# Patient Record
Sex: Female | Born: 1965 | Race: White | Hispanic: No | Marital: Single | Smoking: Current every day smoker
Health system: Southern US, Community
[De-identification: ages and names within clinical notes are randomized; demographics above are authoritative.]

## PROBLEM LIST (undated history)

## (undated) DIAGNOSIS — F1010 Alcohol abuse, uncomplicated: Secondary | ICD-10-CM

## (undated) DIAGNOSIS — I10 Essential (primary) hypertension: Secondary | ICD-10-CM

## (undated) DIAGNOSIS — C801 Malignant (primary) neoplasm, unspecified: Secondary | ICD-10-CM

## (undated) HISTORY — PX: OTHER SURGICAL HISTORY: SHX169

## (undated) HISTORY — PX: MASTECTOMY: SHX3

---

## 2000-11-16 ENCOUNTER — Emergency Department (HOSPITAL_COMMUNITY): Admission: EM | Admit: 2000-11-16 | Discharge: 2000-11-16 | Payer: Self-pay | Admitting: Emergency Medicine

## 2000-11-16 ENCOUNTER — Encounter: Payer: Self-pay | Admitting: Emergency Medicine

## 2016-04-25 ENCOUNTER — Encounter (HOSPITAL_COMMUNITY): Payer: Self-pay | Admitting: Emergency Medicine

## 2016-04-25 ENCOUNTER — Emergency Department (HOSPITAL_COMMUNITY): Payer: BLUE CROSS/BLUE SHIELD

## 2016-04-25 ENCOUNTER — Emergency Department (HOSPITAL_COMMUNITY)
Admission: EM | Admit: 2016-04-25 | Discharge: 2016-04-25 | Disposition: A | Discharge status: Home / Self Care | Payer: BLUE CROSS/BLUE SHIELD | Attending: Emergency Medicine | Admitting: Emergency Medicine

## 2016-04-25 DIAGNOSIS — Z853 Personal history of malignant neoplasm of breast: Secondary | ICD-10-CM | POA: Insufficient documentation

## 2016-04-25 DIAGNOSIS — F17210 Nicotine dependence, cigarettes, uncomplicated: Secondary | ICD-10-CM | POA: Insufficient documentation

## 2016-04-25 DIAGNOSIS — M546 Pain in thoracic spine: Secondary | ICD-10-CM | POA: Diagnosis present

## 2016-04-25 DIAGNOSIS — R200 Anesthesia of skin: Secondary | ICD-10-CM | POA: Diagnosis not present

## 2016-04-25 DIAGNOSIS — I10 Essential (primary) hypertension: Secondary | ICD-10-CM | POA: Insufficient documentation

## 2016-04-25 DIAGNOSIS — M79601 Pain in right arm: Secondary | ICD-10-CM | POA: Insufficient documentation

## 2016-04-25 HISTORY — DX: Malignant (primary) neoplasm, unspecified: C80.1

## 2016-04-25 HISTORY — DX: Essential (primary) hypertension: I10

## 2016-04-25 HISTORY — DX: Alcohol abuse, uncomplicated: F10.10

## 2016-04-25 LAB — URINALYSIS, ROUTINE W REFLEX MICROSCOPIC
Bilirubin Urine: NEGATIVE
Glucose, UA: NEGATIVE mg/dL
Hgb urine dipstick: NEGATIVE
Ketones, ur: NEGATIVE mg/dL
Leukocytes, UA: NEGATIVE
Nitrite: NEGATIVE
Protein, ur: NEGATIVE mg/dL
Specific Gravity, Urine: 1.015 (ref 1.005–1.030)
pH: 7.5 (ref 5.0–8.0)

## 2016-04-25 LAB — CBC WITH DIFFERENTIAL/PLATELET
Basophils Absolute: 0 10*3/uL (ref 0.0–0.1)
Basophils Relative: 0 %
Eosinophils Absolute: 0.3 10*3/uL (ref 0.0–0.7)
Eosinophils Relative: 5 %
HCT: 34.3 % — ABNORMAL LOW (ref 36.0–46.0)
Hemoglobin: 11.6 g/dL — ABNORMAL LOW (ref 12.0–15.0)
Lymphocytes Relative: 18 %
Lymphs Abs: 1.3 10*3/uL (ref 0.7–4.0)
MCH: 32.8 pg (ref 26.0–34.0)
MCHC: 33.8 g/dL (ref 30.0–36.0)
MCV: 96.9 fL (ref 78.0–100.0)
Monocytes Absolute: 0.7 10*3/uL (ref 0.1–1.0)
Monocytes Relative: 10 %
Neutro Abs: 4.6 10*3/uL (ref 1.7–7.7)
Neutrophils Relative %: 67 %
Platelets: 222 10*3/uL (ref 150–400)
RBC: 3.54 MIL/uL — ABNORMAL LOW (ref 3.87–5.11)
RDW: 13.1 % (ref 11.5–15.5)
WBC: 6.9 10*3/uL (ref 4.0–10.5)

## 2016-04-25 LAB — COMPREHENSIVE METABOLIC PANEL
ALT: 16 U/L (ref 14–54)
AST: 19 U/L (ref 15–41)
Albumin: 4 g/dL (ref 3.5–5.0)
Alkaline Phosphatase: 90 U/L (ref 38–126)
Anion gap: 7 (ref 5–15)
BUN: 10 mg/dL (ref 6–20)
CO2: 27 mmol/L (ref 22–32)
Calcium: 9.1 mg/dL (ref 8.9–10.3)
Chloride: 103 mmol/L (ref 101–111)
Creatinine, Ser: 0.96 mg/dL (ref 0.44–1.00)
GFR calc Af Amer: >60 mL/min (ref >60)
GFR calc non Af Amer: >60 mL/min (ref >60)
Glucose, Bld: 110 mg/dL — ABNORMAL HIGH (ref 65–99)
Potassium: 3.8 mmol/L (ref 3.5–5.1)
Sodium: 137 mmol/L (ref 135–145)
Total Bilirubin: 1 mg/dL (ref 0.3–1.2)
Total Protein: 6.8 g/dL (ref 6.5–8.1)

## 2016-04-25 MED ORDER — CYCLOBENZAPRINE HCL 10 MG PO TABS: 10.0000 mg | ORAL_TABLET | Freq: Three times a day (TID) | ORAL | 0 refills | Status: AC | PRN

## 2016-04-25 MED ORDER — MELOXICAM 7.5 MG PO TABS: 7.5000 mg | ORAL_TABLET | Freq: Every day | ORAL | 0 refills | Status: DC

## 2016-04-25 MED ORDER — LIDOCAINE 5 % EX PTCH: 1.0000 | MEDICATED_PATCH | CUTANEOUS | 0 refills | Status: AC

## 2016-04-25 NOTE — Progress Notes (Addendum)
Urine obtained and sent. Pt is in the x-ray dept. (4:20pm)Bloods drawn and sent. Pt c/o joint pain all over times 7 days. Pt is still waiting for blood work results. (5pm) Results back PA aware.(5:40pm)

## 2016-04-25 NOTE — Discharge Instructions (Signed)
Read the information below.  Use the prescribed medication as directed.  Please discuss all new medications with your pharmacist.  You may return to the Emergency Department at any time for worsening condition or any new symptoms that concern you.   If you develop uncontrolled pain, weakness or numbness of the extremity, severe discoloration of the skin, or you are unable to walk or move your arm, return to the ER for a recheck.

## 2016-04-25 NOTE — ED Triage Notes (Addendum)
Pt complaint of back pain for 4 days; acute worsening yesterday.

## 2016-04-25 NOTE — ED Provider Notes (Signed)
[facility] DEPT Provider Note  First Provider Contact:   First MD Initiated Contact with Patient 04/25/16 1547     By signing my name below, I, Bea Graff, attest that this documentation has been prepared under the direction and in the presence of [facility], PA-C. Electronically Signed: Bea Graff, ED Scribe. 04/25/16. 4:06 PM.  History   Chief Complaint Chief Complaint  Patient presents with  . Back Pain   The history is provided by the patient and medical records. No language interpreter was used.    HPI Comments:  Candice Johnson is a 50 y.o. female who presents to the Emergency Department complaining of mid back pain that began about four days ago. She reports some intermittent tingling and numbness in the fingers right hand that began about 10 days ago. She reports some intermittent sharp pains in the left hand a few days ago that has since resolved. She now reports some sharp, shooting bilateral knee pain and reports generalized pain in all of her joints. She states she contacted her PCP yesterday and was told to keep her previously scheduled appt in five days and report to the ED if symptoms worsen. She has not taken anything for pain. Ambulating and moving around increases her pain. She denies alleviating factors. She denies fever, chills, nausea, vomiting, CP, SOB, abdominal pain, diarrhea, urinary symptoms. She states she was on Lisinopril but was stopped 10-12 days ago due to a dry cough that has resolved upon discontinuation of this medication. She recently stopped her Lactulose as well due to high ammonia levels.  Pt is right-hand dominant. She denies h/o arthritis. She denies any known tick bites. She reports h/o psoriasis and uses hydrocortisone lotion for treatment. She reports h/o breast cancer and had a double mastectomy 12 years ago (2005) and was on Tamoxifen for five years afterwards. She denies any heavy lifting, trauma, injury or fall.  Past Medical History:   Diagnosis Date  . Cancer (HCC)    breast  . ETOH abuse   . Hypertension     There are no active problems to display for this patient.   Past Surgical History:  Procedure Laterality Date  . left knee surgery    . MASTECTOMY      OB History    No data available       Home Medications    Prior to Admission medications   Not on File    Family History No family history on file.  Social History Social History  Substance Use Topics  . Smoking status: Current Every Day Smoker    Packs/day: 0.50    Types: Cigarettes  . Smokeless tobacco: Not on file  . Alcohol use No     Allergies   Review of patient's allergies indicates no known allergies.   Review of Systems Review of Systems  Constitutional: Negative for chills and fever.  Respiratory: Negative for shortness of breath.   Cardiovascular: Negative for chest pain.  Gastrointestinal: Negative for abdominal pain, diarrhea, nausea and vomiting.  Genitourinary: Negative for decreased urine volume, difficulty urinating, dysuria, frequency, hematuria and urgency.  Musculoskeletal: Positive for arthralgias and back pain.  Neurological: Positive for numbness (tingling).     Physical Exam Updated Vital Signs BP 119/74 (BP Location: Left Arm)   Pulse 90   Temp 98.3 F (36.8 C) (Oral)   Resp 16   SpO2 96%   Physical Exam  Constitutional: She appears well-developed and well-nourished. No distress.  HENT:  Head: Normocephalic  and atraumatic.  Neck: Neck supple.  Cardiovascular: Normal rate, regular rhythm and normal heart sounds.  Exam reveals no gallop and no friction rub.   No murmur heard. Pulmonary/Chest: Effort normal and breath sounds normal. No respiratory distress. She has no wheezes. She has no rales.  Abdominal: Soft. Bowel sounds are normal. She exhibits no distension. There is tenderness (diffuse). There is no rebound and no guarding.  Musculoskeletal: She exhibits tenderness.  Mild tenderness to  upper thoracic spine.  No joints with erythema, edema, warmth.    Neurological: She is alert.  Lower extremities:  Strength 5/5, sensation intact, distal pulses intact. Symmetric weakness throughout extremities.  Gait is slow but even and steady.   Skin: She is not diaphoretic.  Nursing note and vitals reviewed.    ED Treatments / Results  Labs (all labs ordered are listed, but only abnormal results are displayed) Labs Reviewed  COMPREHENSIVE METABOLIC PANEL - Abnormal; Notable for the following:       Result Value   Glucose, Bld 110 (*)    All other components within normal limits  CBC WITH DIFFERENTIAL/PLATELET - Abnormal; Notable for the following:    RBC 3.54 (*)    Hemoglobin 11.6 (*)    HCT 34.3 (*)    All other components within normal limits  URINALYSIS, ROUTINE W REFLEX MICROSCOPIC (NOT AT [facility]) - Abnormal; Notable for the following:    APPearance CLOUDY (*)    All other components within normal limits    EKG  EKG Interpretation None       Radiology Dg Chest 2 View  Result Date: 04/25/2016 CLINICAL DATA:  Pain without trauma EXAM: CHEST  2 VIEW COMPARISON:  None. FINDINGS: The heart size and mediastinal contours are within normal limits. Both lungs are clear. The visualized skeletal structures are unremarkable. IMPRESSION: No active cardiopulmonary disease. Electronically Signed   By: Dorise Bullion III M.D   On: 04/25/2016 17:12   Dg Cervical Spine Complete  Result Date: 04/25/2016 CLINICAL DATA:  Neck pain for 4 days.  No trauma. EXAM: CERVICAL SPINE - COMPLETE 4+ VIEW COMPARISON:  None. FINDINGS: The pre odontoid space and prevertebral soft tissues are normal. 2 mm of retrolisthesis of C5 versus C6 is probably degenerative given the lack of trauma. No other malalignment. No fracture identified. Mild degenerative changes at C5-6. The neural foramina are patent. The lateral masses of C1 align with C2. The odontoid process is normal. Lung apices are unremarkable.  IMPRESSION: Minimal retrolisthesis of C5 versus C6, likely degenerative given the lack of a trauma history. Mild degenerative changes at this level. No other abnormalities. Electronically Signed   By: Dorise Bullion III M.D   On: 04/25/2016 17:10   Dg Thoracic Spine 2 View  Result Date: 04/25/2016 CLINICAL DATA:  Pain without trauma. EXAM: THORACIC SPINE 2 VIEWS COMPARISON:  None. FINDINGS: There is no evidence of thoracic spine fracture. Alignment is normal. No other significant bone abnormalities are identified. IMPRESSION: Negative. Electronically Signed   By: Dorise Bullion III M.D   On: 04/25/2016 17:11    Procedures Procedures (including critical care time)  Medications Ordered in ED Medications - No data to display   Initial Impression / Assessment and Plan / ED Course  I have reviewed the triage vital signs and the nursing notes.  Pertinent labs & imaging results that were available during my care of the patient were reviewed by me and considered in my medical decision making (see chart for details).  Clinical Course  DIAGNOSTIC STUDIES:   COORDINATION OF CARE: 4:02 PM- Will order labs and imaging. Pt verbalizes understanding and agrees to plan.  Medications - No data to display   Afebrile, nontoxic patient with diffuse pain, primarily in her right arm with occasional tingling in her right fingers.  Workup reassuring, may have radiculopathy related to degenerative changes in C5/C6 area.  C/O arthralgia, no join space appears infected or inflamed. Has PCP follow up scheduled for next week.  D/C home with mobic, lidoderm patch, flexeril.   Discussed result, findings, treatment, and follow up  with patient.  Pt given return precautions.  Pt verbalizes understanding and agrees with plan.       I personally performed the services described in this documentation, which was scribed in my presence. The recorded information has been reviewed and is accurate.   Final Clinical  Impressions(s) / ED Diagnoses   Final diagnoses:  Right-sided thoracic back pain  Right arm pain    New Prescriptions Discharge Medication List as of 04/25/2016  6:02 PM    START taking these medications   Details  cyclobenzaprine (FLEXERIL) 10 MG tablet Take 1 tablet (10 mg total) by mouth 3 (three) times daily as needed for muscle spasms (and pain)., Starting Sat 04/25/2016, Print    lidocaine (LIDODERM) 5 % Place 1 patch onto the skin daily. Remove & Discard patch within 12 hours or as directed by MD, Starting Sat 04/25/2016, Print    meloxicam (MOBIC) 7.5 MG tablet Take 1 tablet (7.5 mg total) by mouth daily., Starting Sat 04/25/2016, Print         [place], [place] 04/25/16 1811    Gareth Morgan, MD 04/28/16 [phone]

## 2016-05-04 ENCOUNTER — Inpatient Hospital Stay (HOSPITAL_COMMUNITY)
Admission: EM | Admit: 2016-05-04 | Discharge: 2016-05-06 | DRG: 897 | Disposition: A | Discharge status: Home / Self Care | Payer: BLUE CROSS/BLUE SHIELD | Attending: Family Medicine | Admitting: Family Medicine

## 2016-05-04 DIAGNOSIS — R7401 Elevation of levels of liver transaminase levels: Secondary | ICD-10-CM

## 2016-05-04 DIAGNOSIS — Y908 Blood alcohol level of 240 mg/100 ml or more: Secondary | ICD-10-CM | POA: Diagnosis present

## 2016-05-04 DIAGNOSIS — I10 Essential (primary) hypertension: Secondary | ICD-10-CM | POA: Diagnosis present

## 2016-05-04 DIAGNOSIS — F10231 Alcohol dependence with withdrawal delirium: Principal | ICD-10-CM | POA: Diagnosis present

## 2016-05-04 DIAGNOSIS — F10931 Alcohol use, unspecified with withdrawal delirium: Secondary | ICD-10-CM | POA: Diagnosis present

## 2016-05-04 DIAGNOSIS — Z7289 Other problems related to lifestyle: Secondary | ICD-10-CM | POA: Diagnosis present

## 2016-05-04 DIAGNOSIS — E872 Acidosis: Secondary | ICD-10-CM | POA: Diagnosis present

## 2016-05-04 DIAGNOSIS — Z791 Long term (current) use of non-steroidal anti-inflammatories (NSAID): Secondary | ICD-10-CM

## 2016-05-04 DIAGNOSIS — F10939 Alcohol use, unspecified with withdrawal, unspecified: Secondary | ICD-10-CM | POA: Diagnosis present

## 2016-05-04 DIAGNOSIS — F1010 Alcohol abuse, uncomplicated: Secondary | ICD-10-CM

## 2016-05-04 DIAGNOSIS — E876 Hypokalemia: Secondary | ICD-10-CM

## 2016-05-04 DIAGNOSIS — F1090 Alcohol use, unspecified, uncomplicated: Secondary | ICD-10-CM | POA: Diagnosis present

## 2016-05-04 DIAGNOSIS — Z8249 Family history of ischemic heart disease and other diseases of the circulatory system: Secondary | ICD-10-CM

## 2016-05-04 DIAGNOSIS — F10239 Alcohol dependence with withdrawal, unspecified: Secondary | ICD-10-CM | POA: Diagnosis present

## 2016-05-04 DIAGNOSIS — R740 Nonspecific elevation of levels of transaminase and lactic acid dehydrogenase [LDH]: Secondary | ICD-10-CM | POA: Diagnosis present

## 2016-05-04 DIAGNOSIS — F10221 Alcohol dependence with intoxication delirium: Secondary | ICD-10-CM | POA: Diagnosis present

## 2016-05-04 DIAGNOSIS — F17210 Nicotine dependence, cigarettes, uncomplicated: Secondary | ICD-10-CM | POA: Diagnosis present

## 2016-05-04 MED ORDER — THIAMINE HCL 100 MG/ML IJ SOLN
100.0000 mg | Freq: Once | INTRAMUSCULAR | Status: AC
  Administered 2016-05-05: 100 mg via INTRAVENOUS
  Filled 2016-05-04: qty 2

## 2016-05-04 MED ORDER — LORAZEPAM 2 MG/ML IJ SOLN
2.0000 mg | INTRAMUSCULAR | Status: AC | PRN
  Administered 2016-05-05 (×2): 2 mg via INTRAVENOUS
  Filled 2016-05-04 (×2): qty 1

## 2016-05-04 MED ORDER — SODIUM CHLORIDE 0.9 % IV BOLUS (SEPSIS)
1000.0000 mL | Freq: Once | INTRAVENOUS | Status: AC
  Administered 2016-05-05: 1000 mL via INTRAVENOUS

## 2016-05-04 NOTE — ED Triage Notes (Signed)
Pt BIB friend after being sober from alcohol x 60 days and relapsing today. Pt is very anxious and scared. Pt [facility]. Pt drinks vodka and drank 'a lot' this weekend. States she has had seizures before in withdrawal. Alert.

## 2016-05-04 NOTE — ED Notes (Signed)
RN sts to hold on blood draw. Pt uncooperative at this time.  RN will alert phelebotomy or RN will draw labs when pt is cooperative.

## 2016-05-04 NOTE — ED Triage Notes (Signed)
Dr. Jeneen Rinks made aware of patient's condition.

## 2016-05-04 NOTE — ED Notes (Signed)
Bed: WA07 Expected date:  Expected time:  Means of arrival:  Comments: HOld for triage 4

## 2016-05-05 ENCOUNTER — Encounter (HOSPITAL_COMMUNITY): Payer: Self-pay | Admitting: Internal Medicine

## 2016-05-05 DIAGNOSIS — F17210 Nicotine dependence, cigarettes, uncomplicated: Secondary | ICD-10-CM | POA: Diagnosis present

## 2016-05-05 DIAGNOSIS — F1090 Alcohol use, unspecified, uncomplicated: Secondary | ICD-10-CM | POA: Diagnosis present

## 2016-05-05 DIAGNOSIS — F10931 Alcohol use, unspecified with withdrawal delirium: Secondary | ICD-10-CM | POA: Diagnosis present

## 2016-05-05 DIAGNOSIS — E872 Acidosis: Secondary | ICD-10-CM | POA: Diagnosis present

## 2016-05-05 DIAGNOSIS — F10239 Alcohol dependence with withdrawal, unspecified: Secondary | ICD-10-CM | POA: Diagnosis not present

## 2016-05-05 DIAGNOSIS — F1010 Alcohol abuse, uncomplicated: Secondary | ICD-10-CM

## 2016-05-05 DIAGNOSIS — Z8249 Family history of ischemic heart disease and other diseases of the circulatory system: Secondary | ICD-10-CM | POA: Diagnosis not present

## 2016-05-05 DIAGNOSIS — F10231 Alcohol dependence with withdrawal delirium: Secondary | ICD-10-CM | POA: Diagnosis present

## 2016-05-05 DIAGNOSIS — Y908 Blood alcohol level of 240 mg/100 ml or more: Secondary | ICD-10-CM | POA: Diagnosis present

## 2016-05-05 DIAGNOSIS — Z791 Long term (current) use of non-steroidal anti-inflammatories (NSAID): Secondary | ICD-10-CM | POA: Diagnosis not present

## 2016-05-05 DIAGNOSIS — Z7289 Other problems related to lifestyle: Secondary | ICD-10-CM | POA: Diagnosis present

## 2016-05-05 DIAGNOSIS — F10221 Alcohol dependence with intoxication delirium: Secondary | ICD-10-CM | POA: Diagnosis present

## 2016-05-05 DIAGNOSIS — F10939 Alcohol use, unspecified with withdrawal, unspecified: Secondary | ICD-10-CM | POA: Diagnosis present

## 2016-05-05 DIAGNOSIS — R740 Nonspecific elevation of levels of transaminase and lactic acid dehydrogenase [LDH]: Secondary | ICD-10-CM | POA: Diagnosis not present

## 2016-05-05 DIAGNOSIS — E876 Hypokalemia: Secondary | ICD-10-CM | POA: Diagnosis not present

## 2016-05-05 DIAGNOSIS — I10 Essential (primary) hypertension: Secondary | ICD-10-CM | POA: Diagnosis present

## 2016-05-05 LAB — CBC WITH DIFFERENTIAL/PLATELET
BASOS ABS: 0.1 10*3/uL (ref 0.0–0.1)
Basophils Absolute: 0.1 10*3/uL (ref 0.0–0.1)
Basophils Relative: 2 %
Basophils Relative: 2 %
EOS ABS: 0.1 10*3/uL (ref 0.0–0.7)
EOS ABS: 0.1 10*3/uL (ref 0.0–0.7)
EOS PCT: 4 %
EOS PCT: 2 %
HCT: 32.5 % — ABNORMAL LOW (ref 36.0–46.0)
HCT: 37.4 % (ref 36.0–46.0)
HEMOGLOBIN: 11.1 g/dL — AB (ref 12.0–15.0)
HEMOGLOBIN: 13 g/dL (ref 12.0–15.0)
LYMPHS ABS: 2.1 10*3/uL (ref 0.7–4.0)
LYMPHS PCT: 35 %
Lymphocytes Relative: 43 %
Lymphs Abs: 1.2 10*3/uL (ref 0.7–4.0)
MCH: 32 pg (ref 26.0–34.0)
MCH: 32 pg (ref 26.0–34.0)
MCHC: 34.2 g/dL (ref 30.0–36.0)
MCHC: 34.8 g/dL (ref 30.0–36.0)
MCV: 92.1 fL (ref 78.0–100.0)
MCV: 93.7 fL (ref 78.0–100.0)
MONO ABS: 0.5 10*3/uL (ref 0.1–1.0)
MONOS PCT: 9 %
Monocytes Absolute: 0.3 10*3/uL (ref 0.1–1.0)
Monocytes Relative: 9 %
NEUTROS PCT: 50 %
Neutro Abs: 1.7 10*3/uL (ref 1.7–7.7)
Neutro Abs: 2.2 10*3/uL (ref 1.7–7.7)
Neutrophils Relative %: 44 %
PLATELETS: 175 10*3/uL (ref 150–400)
PLATELETS: 230 10*3/uL (ref 150–400)
RBC: 3.47 MIL/uL — AB (ref 3.87–5.11)
RBC: 4.06 MIL/uL (ref 3.87–5.11)
RDW: 13.3 % (ref 11.5–15.5)
RDW: 13.1 % (ref 11.5–15.5)
WBC: 3.3 10*3/uL — AB (ref 4.0–10.5)
WBC: 5 10*3/uL (ref 4.0–10.5)

## 2016-05-05 LAB — ETHANOL: Alcohol, Ethyl (B): 262 mg/dL — ABNORMAL HIGH (ref <5)

## 2016-05-05 LAB — COMPREHENSIVE METABOLIC PANEL
ALK PHOS: 118 U/L (ref 38–126)
ALT: 70 U/L — ABNORMAL HIGH (ref 14–54)
ANION GAP: 20 — AB (ref 5–15)
AST: 80 U/L — ABNORMAL HIGH (ref 15–41)
Albumin: 4.8 g/dL (ref 3.5–5.0)
BUN: 10 mg/dL (ref 6–20)
CALCIUM: 9.3 mg/dL (ref 8.9–10.3)
CO2: 24 mmol/L (ref 22–32)
Chloride: 97 mmol/L — ABNORMAL LOW (ref 101–111)
Creatinine, Ser: 0.7 mg/dL (ref 0.44–1.00)
GFR calc non Af Amer: >60 mL/min (ref >60)
Glucose, Bld: 74 mg/dL (ref 65–99)
POTASSIUM: 3.3 mmol/L — AB (ref 3.5–5.1)
SODIUM: 141 mmol/L (ref 135–145)
Total Bilirubin: 1.3 mg/dL — ABNORMAL HIGH (ref 0.3–1.2)
Total Protein: 8.2 g/dL — ABNORMAL HIGH (ref 6.5–8.1)

## 2016-05-05 LAB — BASIC METABOLIC PANEL
Anion gap: 13 (ref 5–15)
BUN: 8 mg/dL (ref 6–20)
CO2: 22 mmol/L (ref 22–32)
CREATININE: 0.52 mg/dL (ref 0.44–1.00)
Calcium: 8 mg/dL — ABNORMAL LOW (ref 8.9–10.3)
Chloride: 103 mmol/L (ref 101–111)
Glucose, Bld: 63 mg/dL — ABNORMAL LOW (ref 65–99)
POTASSIUM: 3.3 mmol/L — AB (ref 3.5–5.1)
SODIUM: 138 mmol/L (ref 135–145)

## 2016-05-05 LAB — RAPID URINE DRUG SCREEN, HOSP PERFORMED
Amphetamines: NOT DETECTED
Barbiturates: NOT DETECTED
Benzodiazepines: POSITIVE — AB
COCAINE: NOT DETECTED
OPIATES: NOT DETECTED
TETRAHYDROCANNABINOL: NOT DETECTED

## 2016-05-05 LAB — HEPATIC FUNCTION PANEL
ALBUMIN: 4 g/dL (ref 3.5–5.0)
ALK PHOS: 102 U/L (ref 38–126)
ALT: 55 U/L — ABNORMAL HIGH (ref 14–54)
AST: 63 U/L — AB (ref 15–41)
Bilirubin, Direct: 0.2 mg/dL (ref 0.1–0.5)
Indirect Bilirubin: 1.2 mg/dL — ABNORMAL HIGH (ref 0.3–0.9)
TOTAL PROTEIN: 7 g/dL (ref 6.5–8.1)
Total Bilirubin: 1.4 mg/dL — ABNORMAL HIGH (ref 0.3–1.2)

## 2016-05-05 LAB — ACETAMINOPHEN LEVEL

## 2016-05-05 LAB — SALICYLATE LEVEL: Salicylate Lvl: <4 mg/dL (ref 2.8–30.0)

## 2016-05-05 LAB — MAGNESIUM: Magnesium: 1.7 mg/dL (ref 1.7–2.4)

## 2016-05-05 LAB — PREGNANCY, URINE: PREG TEST UR: NEGATIVE

## 2016-05-05 MED ORDER — ACETAMINOPHEN 650 MG RE SUPP: 650.0000 mg | Freq: Four times a day (QID) | RECTAL | Status: DC | PRN

## 2016-05-05 MED ORDER — LORAZEPAM 2 MG/ML IJ SOLN: 0.0000 mg | Freq: Four times a day (QID) | INTRAMUSCULAR | Status: DC

## 2016-05-05 MED ORDER — THIAMINE HCL 100 MG/ML IJ SOLN: 100.0000 mg | Freq: Every day | INTRAMUSCULAR | Status: DC

## 2016-05-05 MED ORDER — LORAZEPAM 1 MG PO TABS
1.0000 mg | ORAL_TABLET | Freq: Four times a day (QID) | ORAL | Status: DC | PRN
  Administered 2016-05-05: 1 mg via ORAL
  Filled 2016-05-05 (×2): qty 1

## 2016-05-05 MED ORDER — ACETAMINOPHEN 325 MG PO TABS: 650.0000 mg | ORAL_TABLET | Freq: Four times a day (QID) | ORAL | Status: DC | PRN

## 2016-05-05 MED ORDER — LORAZEPAM 2 MG/ML IJ SOLN: 1.0000 mg | Freq: Four times a day (QID) | INTRAMUSCULAR | Status: DC | PRN

## 2016-05-05 MED ORDER — SODIUM CHLORIDE 0.9 % IV SOLN
Freq: Once | INTRAVENOUS | Status: AC
  Administered 2016-05-05: 02:00:00 via INTRAVENOUS

## 2016-05-05 MED ORDER — ONDANSETRON HCL 4 MG PO TABS: 4.0000 mg | ORAL_TABLET | Freq: Four times a day (QID) | ORAL | Status: DC | PRN

## 2016-05-05 MED ORDER — ADULT MULTIVITAMIN W/MINERALS CH
1.0000 | ORAL_TABLET | Freq: Every day | ORAL | Status: DC
  Administered 2016-05-05 – 2016-05-06 (×2): 1 via ORAL
  Filled 2016-05-05 (×2): qty 1

## 2016-05-05 MED ORDER — ONDANSETRON HCL 4 MG/2ML IJ SOLN: 4.0000 mg | Freq: Four times a day (QID) | INTRAMUSCULAR | Status: DC | PRN

## 2016-05-05 MED ORDER — VITAMIN B-1 100 MG PO TABS
100.0000 mg | ORAL_TABLET | Freq: Every day | ORAL | Status: DC
  Administered 2016-05-05 – 2016-05-06 (×2): 100 mg via ORAL
  Filled 2016-05-05 (×2): qty 1

## 2016-05-05 MED ORDER — FOLIC ACID 1 MG PO TABS
1.0000 mg | ORAL_TABLET | Freq: Every day | ORAL | Status: DC
  Administered 2016-05-05 – 2016-05-06 (×2): 1 mg via ORAL
  Filled 2016-05-05 (×2): qty 1

## 2016-05-05 MED ORDER — LORAZEPAM 2 MG/ML IJ SOLN
0.0000 mg | Freq: Two times a day (BID) | INTRAMUSCULAR | Status: DC
Start: 2016-05-07 — End: 2016-05-06

## 2016-05-05 MED ORDER — SODIUM CHLORIDE 0.9 % IV SOLN
INTRAVENOUS | Status: AC
  Administered 2016-05-05 – 2016-05-06 (×2): via INTRAVENOUS

## 2016-05-05 MED ORDER — POTASSIUM CHLORIDE CRYS ER 20 MEQ PO TBCR
20.0000 meq | EXTENDED_RELEASE_TABLET | Freq: Once | ORAL | Status: AC
  Administered 2016-05-05: 20 meq via ORAL
  Filled 2016-05-05: qty 1

## 2016-05-05 MED ORDER — LORAZEPAM 2 MG/ML IJ SOLN
1.0000 mg | INTRAMUSCULAR | Status: DC | PRN
  Filled 2016-05-05: qty 1

## 2016-05-05 NOTE — Progress Notes (Addendum)
  PROGRESS NOTE  Candice Johnson [id number] DOB: 06/24/1966 DOA: 05/04/2016 PCP: Shary Key, MD  Brief Narrative: 50 year old woman presented to the emergency department after relapse of alcoholism. Noted be tachycardic, hallucinating. Admitted for delirium tremens.  Assessment/Plan: 1. Delirium tremens. Appears stable, hemodynamics are stable. Minimal tremors. Difficult to tell whether she may be having some hallucinations still. 2. Anion gap metabolic acidosis secondary to alcohol intoxication. Resolved. 3. Alcohol abuse   Continue CIWA  Consult CSW  DVT prophylaxis: SCDs Code Status: full code Family Communication: none Disposition Plan: home  Murray Hodgkins, MD  [facility] Direct contact: [phone] --[place] [place]  --[email or web address]; password [email or web address]  7PM-7AM contact night coverage as above 05/05/2016, 11:04 AM  LOS: 1 day   Consultants:    Procedures:    Antimicrobials:    HPI/Subjective: Feels a little bit better. Denies hallucinations. Somewhat tremulous.  Objective: Vitals:   05/05/16 0430 05/05/16 0500 05/05/16 0530 05/05/16 0639  BP: 114/69 127/75 124/76 133/86  Pulse: 110 110 110 (!) 107  Resp: 18 17 16 16   Temp:    98.3 F (36.8 C)  TempSrc:    Oral  SpO2: 98% 98% 98% 98%    Intake/Output Summary (Last 24 hours) at 05/05/16 1104 Last data filed at 05/05/16 0900  Gross per 24 hour  Intake              120 ml  Output                2 ml  Net              118 ml   Exam:    Constitutional:  . Appears Mildly ill. Respiratory:  . CTA bilaterally, no w/r/r.  . Respiratory effort normal. No retractions or accessory muscle use Cardiovascular:  . RRR, no m/r/g . No LE extremity edema   Psychiatric:  . Mental status o Mood appropriate, affect odd o Orientated to person, place, time   I have personally reviewed following labs and imaging studies:  Potassium 3.3.  AST, ALT modestly elevated. Total bilirubin 1.4.  CBC  unremarkable, hemoglobin stable 11.1. Modest leukopenia 3.3.  Scheduled Meds: . folic acid  1 mg Oral Daily  . LORazepam  0-4 mg Intravenous Q6H   Followed by  . [START ON 05/07/2016] LORazepam  0-4 mg Intravenous Q12H  . multivitamin with minerals  1 tablet Oral Daily  . thiamine  100 mg Oral Daily   Or  . thiamine  100 mg Intravenous Daily   Continuous Infusions: . sodium chloride 75 mL/hr at 05/05/16 0550    Principal Problem:   Delirium tremens ([place]) Active Problems:   Alcohol withdrawal ([place])   LOS: 1 day   Time spent 20 minutes

## 2016-05-05 NOTE — H&P (Signed)
History and Physical    Candice Johnson [id number] DOB: [birth year] DOA: 05/04/2016  PCP: Shary Key, MD  Patient coming from: Home.  Chief Complaint: Alcohol detox.  HPI: Candice Johnson is a 50 y.o. female with alcohol abuse who had been to detox 2 months ago started drinking again this weekend. Patient was brought to the ER by patient's pastor. Patient has been drinking Vodka over the weekend and was brought in for detox. While in the ER patient was tachycardic and had brief episodes of hallucinations. Patient was given Ativan and presently is mildly sedated. On my exam patient is answering questions appropriately. Patient will be admitted for alcohol intoxication with early withdrawal signs. Patient denies any chest pain shortness of breath nausea vomiting headache or falls. Patient appears nonfocal.   ED Course: See history of presenting illness.  Review of Systems: As per HPI, rest all negative.   Past Medical History:  Diagnosis Date  . Cancer (HCC)    breast  . ETOH abuse   . Hypertension     Past Surgical History:  Procedure Laterality Date  . left knee surgery    . MASTECTOMY       reports that she has been smoking Cigarettes.  She has been smoking about 0.50 packs per day. She has never used smokeless tobacco. She reports that she does not drink alcohol or use drugs.  No Known Allergies  Family History  Problem Relation Age of Onset  . Hypertension Other     Prior to Admission medications   Medication Sig Start Date End Date Taking? Authorizing Provider  cyclobenzaprine (FLEXERIL) 10 MG tablet Take 1 tablet (10 mg total) by mouth 3 (three) times daily as needed for muscle spasms (and pain). 04/25/16   Clayton Bibles, PA-C  lidocaine (LIDODERM) 5 % Place 1 patch onto the skin daily. Remove & Discard patch within 12 hours or as directed by MD 04/25/16   Clayton Bibles, PA-C  meloxicam (MOBIC) 7.5 MG tablet Take 1 tablet (7.5 mg total) by mouth daily. 04/25/16   Clayton Bibles, PA-C    Physical Exam: Vitals:   05/05/16 0200 05/05/16 0230 05/05/16 0300 05/05/16 0332  BP: 100/62 99/56 104/57 125/76  Pulse: (!) 122 120 114 (!) 123  Resp: 17 17 16 15   Temp:      TempSrc:      SpO2: 93% 93% 95% 97%      Constitutional: Not in distress. Vitals:   05/05/16 0200 05/05/16 0230 05/05/16 0300 05/05/16 0332  BP: 100/62 99/56 104/57 125/76  Pulse: (!) 122 120 114 (!) 123  Resp: 17 17 16 15   Temp:      TempSrc:      SpO2: 93% 93% 95% 97%   Eyes: Anicteric no pallor. ENMT: No discharge from the ears eyes nose or mouth. Neck: No mass felt. No neck rigidity. Respiratory: No rhonchi or crepitations. Cardiovascular: S1 and S2 heard. Abdomen: Soft nontender bowel sounds present. No guarding or rigidity. Musculoskeletal: No edema. Skin: No rash. Neurologic: Alert awake oriented to time place and person. Moves all activities. Psychiatric: Appears normal. Denies any suicide.   Labs on Admission: I have personally reviewed following labs and imaging studies  CBC:  Recent Labs Lab 05/05/16 0007  WBC 5.0  NEUTROABS 2.2  HGB 13.0  HCT 37.4  MCV 92.1  PLT 123456   Basic Metabolic Panel:  Recent Labs Lab 05/05/16 0007  NA 141  K 3.3*  CL 97*  CO2 24  GLUCOSE 74  BUN 10  CREATININE 0.70  CALCIUM 9.3  MG 1.7   GFR: CrCl cannot be calculated (Unknown ideal weight.). Liver Function Tests:  Recent Labs Lab 05/05/16 0007  AST 80*  ALT 70*  ALKPHOS 118  BILITOT 1.3*  PROT 8.2*  ALBUMIN 4.8   No results for input(s): LIPASE, AMYLASE in the last 168 hours. No results for input(s): AMMONIA in the last 168 hours. Coagulation Profile: No results for input(s): INR, PROTIME in the last 168 hours. Cardiac Enzymes: No results for input(s): CKTOTAL, CKMB, CKMBINDEX, TROPONINI in the last 168 hours. BNP (last 3 results) No results for input(s): PROBNP in the last 8760 hours. HbA1C: No results for input(s): HGBA1C in the last 72 hours. CBG: No  results for input(s): GLUCAP in the last 168 hours. Lipid Profile: No results for input(s): CHOL, HDL, LDLCALC, TRIG, CHOLHDL, LDLDIRECT in the last 72 hours. Thyroid Function Tests: No results for input(s): TSH, T4TOTAL, FREET4, T3FREE, THYROIDAB in the last 72 hours. Anemia Panel: No results for input(s): VITAMINB12, FOLATE, FERRITIN, TIBC, IRON, RETICCTPCT in the last 72 hours. Urine analysis:    Component Value Date/Time   COLORURINE YELLOW 04/25/2016 1602   APPEARANCEUR CLOUDY (A) 04/25/2016 1602   LABSPEC 1.015 04/25/2016 1602   PHURINE 7.5 04/25/2016 1602   GLUCOSEU NEGATIVE 04/25/2016 1602   HGBUR NEGATIVE 04/25/2016 1602   BILIRUBINUR NEGATIVE 04/25/2016 1602   KETONESUR NEGATIVE 04/25/2016 1602   PROTEINUR NEGATIVE 04/25/2016 1602   NITRITE NEGATIVE 04/25/2016 1602   LEUKOCYTESUR NEGATIVE 04/25/2016 1602   Sepsis Labs: @LABRCNTIP (procalcitonin:4,lacticidven:4) )No results found for this or any previous visit (from the past 240 hour(s)).   Radiological Exams on Admission: No results found.   Assessment/Plan Principal Problem:   Delirium tremens ([facility]) Active Problems:   Alcohol withdrawal ([place])    1. Delirium tremens alcohol withdrawal - patient has been placed on IV Ativan for alcohol withdrawal using CIWA protocol. Closely monitor in telemetry. 2. Elevated anion gap probably from alcoholism - will check salicylate levels. Denies drinking any other type of alcohol. 3. Mild hypokalemia - replace and recheck.  EKG and pregnancy screen are pending. Salicylate levels and Tylenol levels are pending. But patient denies any suicidal ideations.   DVT prophylaxis: SCDs. Code Status: Full code.  Family Communication: Discussed with patient.  Disposition Plan: To be determined mind.  Consults called: None.  Admission status: Observation telemetry.    Rise Patience MD [facility] Pager [phone].  If 7PM-7AM, please contact  night-coverage [email or web address] Password [facility]  05/05/2016, 5:24 AM

## 2016-05-05 NOTE — ED Provider Notes (Signed)
[facility] DEPT Provider Note   CSN: [id number] Arrival date & time: 05/04/16  2156     History   Chief Complaint Chief Complaint  Patient presents with  . Alcohol Problem    HPI Candice Johnson is a 50 y.o. female.  She is here accompanied by her pastor. She has a history of alcoholism. She was sober for several months. She "drank heavily" this weekend until about 24 hours ago. States her drink of choice is vodka. She is here with a complaint of "being scared" she is apparently hallucinating intimately agitated and paranoid. I was asked to see the patient at triage by nursing.  HPI  Past Medical History:  Diagnosis Date  . Cancer (HCC)    breast  . ETOH abuse   . Hypertension     There are no active problems to display for this patient.   Past Surgical History:  Procedure Laterality Date  . left knee surgery    . MASTECTOMY      OB History    No data available       Home Medications    Prior to Admission medications   Medication Sig Start Date End Date Taking? Authorizing Provider  cyclobenzaprine (FLEXERIL) 10 MG tablet Take 1 tablet (10 mg total) by mouth 3 (three) times daily as needed for muscle spasms (and pain). 04/25/16   Clayton Bibles, PA-C  lidocaine (LIDODERM) 5 % Place 1 patch onto the skin daily. Remove & Discard patch within 12 hours or as directed by MD 04/25/16   Clayton Bibles, PA-C  meloxicam (MOBIC) 7.5 MG tablet Take 1 tablet (7.5 mg total) by mouth daily. 04/25/16   Clayton Bibles, PA-C    Family History No family history on file.  Social History Social History  Substance Use Topics  . Smoking status: Current Every Day Smoker    Packs/day: 0.50    Types: Cigarettes  . Smokeless tobacco: Not on file  . Alcohol use No     Allergies   Review of patient's allergies indicates no known allergies.   Review of Systems Review of Systems  Constitutional: Negative for appetite change, chills, diaphoresis, fatigue and fever.  HENT: Negative for  mouth sores, sore throat and trouble swallowing.   Eyes: Negative for visual disturbance.  Respiratory: Negative for cough, chest tightness, shortness of breath and wheezing.   Cardiovascular: Negative for chest pain.  Gastrointestinal: Negative for abdominal distention, abdominal pain, diarrhea, nausea and vomiting.  Endocrine: Negative for polydipsia, polyphagia and polyuria.  Genitourinary: Negative for dysuria, frequency and hematuria.  Musculoskeletal: Negative for gait problem.  Skin: Negative for color change, pallor and rash.  Neurological: Negative for dizziness, syncope, light-headedness and headaches.  Hematological: Does not bruise/bleed easily.  Psychiatric/Behavioral: Positive for agitation and hallucinations. Negative for behavioral problems, confusion, self-injury, sleep disturbance and suicidal ideas. The patient is nervous/anxious and is hyperactive.      Physical Exam Updated Vital Signs BP 120/71 (BP Location: Left Arm)   Pulse 113   Temp 98.1 F (36.7 C) (Oral)   Resp 17   SpO2 99%   Physical Exam  Constitutional: She is oriented to person, place, and time. She appears well-developed and well-nourished. No distress.  HENT:  Head: Normocephalic.  Eyes: Conjunctivae are normal. Pupils are equal, round, and reactive to light. No scleral icterus.  Neck: Normal range of motion. Neck supple. No thyromegaly present.  Cardiovascular: Normal rate and regular rhythm.  Exam reveals no gallop and no friction rub.  No murmur heard. Pulmonary/Chest: Effort normal and breath sounds normal. No respiratory distress. She has no wheezes. She has no rales.  Abdominal: Soft. Bowel sounds are normal. She exhibits no distension. There is no tenderness. There is no rebound.  Musculoskeletal: Normal range of motion.  Neurological: She is alert and oriented to person, place, and time.  Skin: Skin is warm and dry. No rash noted.  Psychiatric: Her mood appears anxious. Her affect is  labile. Her speech is rapid and/or pressured. She is hyperactive. Thought content is paranoid. She expresses no suicidal ideation. She expresses no suicidal plans.  Patient intermittently looking just over my shoulder and cowers toward the corner of the bed. Denies that she is visually hallucinating but appears to be doing so. Then will occasionally turn towards the wall and begin frantically scratching at the wall but cannot specify why. Again, apparent hallucinations.     ED Treatments / Results  Labs (all labs ordered are listed, but only abnormal results are displayed) Labs Reviewed  ETHANOL - Abnormal; Notable for the following:       Result Value   Alcohol, Ethyl (B) 262 (*)    All other components within normal limits  COMPREHENSIVE METABOLIC PANEL - Abnormal; Notable for the following:    Potassium 3.3 (*)    Chloride 97 (*)    Total Protein 8.2 (*)    AST 80 (*)    ALT 70 (*)    Total Bilirubin 1.3 (*)    Anion gap 20 (*)    All other components within normal limits  CBC WITH DIFFERENTIAL/PLATELET  MAGNESIUM  URINE RAPID DRUG SCREEN, HOSP PERFORMED  URINE RAPID DRUG SCREEN, HOSP PERFORMED    EKG  EKG Interpretation None       Radiology No results found.  Procedures Procedures (including critical care time)  Medications Ordered in ED Medications  0.9 %  sodium chloride infusion (not administered)  LORazepam (ATIVAN) injection 1 mg (not administered)  thiamine (B-1) injection 100 mg (100 mg Intravenous Given 05/05/16 0006)  LORazepam (ATIVAN) injection 2 mg (2 mg Intravenous Given 05/05/16 0109)  sodium chloride 0.9 % bolus 1,000 mL (1,000 mLs Intravenous New Bag/Given 05/05/16 0007)     Initial Impression / Assessment and Plan / ED Course  I have reviewed the triage vital signs and the nursing notes.  Pertinent labs & imaging results that were available during my care of the patient were reviewed by me and considered in my medical decision making (see chart  for details).  Clinical Course    Plan will be benzodiazepines fluids lab evaluation tox screen reevaluation. Patient may need inpatient admission for her acute alcohol withdrawal syndrome.  Final Clinical Impressions(s) / ED Diagnoses   Final diagnoses:  Alcohol abuse  Alcohol withdrawal, with unspecified complication ([place])    0000000:  Patient sleeping but remains tachycardic. Refuses catheterization. Awaiting urine sample. With the paranoia, tachycardia, and still elevated alcohol I and awaiting urine toxicology. If cocaine or amphetamine positive would suggest continued ED observation benzodiazepines as needed and upon more appropriate behavior in stable vitals discharged home.   If tox negative. this is all likely withdrawal despite her still elevated alcohol level that would recommend admission for fluids and C1 protocol.  New Prescriptions New Prescriptions   No medications on file     Tanna Furry, MD 05/05/16 [phone]

## 2016-05-05 NOTE — ED Notes (Signed)
Candice Johnson has patients' debit card.

## 2016-05-05 NOTE — Clinical Social Work Note (Addendum)
Clinical Social Work Assessment  Patient Details  Name: Candice Johnson MRN: [id number] Date of Birth: 01/13/1966  Date of referral:  05/05/16               Reason for consult:  Substance Use/ETOH Abuse                Permission sought to share information with:  Case Manager Permission granted to share information::     Name::        Agency::     Relationship::     Contact Information:     Housing/Transportation Living arrangements for the past 2 months:   ([place]) Source of Information:  Patient Patient Interpreter Needed:  None Criminal Activity/Legal Involvement Pertinent to Current Situation/Hospitalization:  No - Comment as needed Significant Relationships:  Other(Comment) Lives with:  Self Do you feel safe going back to the place where you live?  Yes Need for family participation in patient care:  No (Coment)  Care giving concerns:  Patient reports she has been sober for one month. Patient reports she was living at the [facility] to continue to treatment and she left because the women there were mean, there was drama and stressful. Patient reports she returned home and started to drink to cope with the stress. She reports, "all I wanted to do was hold my dog and hide". Patient reports prior to [place] she was in treatment at [facility] for twenty eight days. Patient reports she started drinking heavy about six years ago but denies any stressors that may have caused the drinking. Patient reports while at [facility] she saw a psychiatrist but does not remember if he diagnosed or prescribed medication for mental health because she was taking so many. Patient denies SI/HI.    Social Worker assessment / plan: LCSWA met patient and church leader at bedside. Patient allowed church leader to leave room before completing assessment. Patient reports she has been trying to stay sober but recently relapse after leaving [place]. Patient reports feeling disappointed  that she relapse but is hopeful to start over. "I am hoping to find another job, clean my home and get my life together. Patient reports she is not interested inpatient for substance abuse but agreed to look at list for outpatient. LCSWA provided patient with list of outpatient resources and LCSWA information, if she needs assistance setting up appointment while here at hospital.  Employment status:  Unemployed Insurance information:  Other (Comment Required) ([employer] [place] ) PT Recommendations:  Not assessed at this time Information / Referral to community resources:   SA outpatient  Patient/Family's Response to care:  Patient is agreeable to care. Patient reports her Aunt, cousins and church members are very supportive and want her to continue with treatment.   Patient/Family's Understanding of and Emotional Response to Diagnosis, Current Treatment, and Prognosis:" Patient understands she has a problem with drinking and reports she has made efforts to stay sober. Patient reports she is hope to keep trying. Patient was receptive to SA treatment resources Emotional Assessment Appearance:  Appears stated age Attitude/Demeanor/Rapport:    Affect (typically observed):  Calm, Sad, Flat, Hopeful Orientation:  Oriented to Self, Oriented to Place, Oriented to  Time, Oriented to Situation Alcohol / Substance use:  Other Psych involvement (Current and /or in the community):  No (Comment)  Discharge Needs  Concerns to be addressed:  Substance Abuse Concerns Readmission within the last 30 days:  No Current discharge risk:  Substance Abuse Barriers to Discharge:  Active Substance Use   Lia Hopping, LCSW 05/05/2016, 3:19 PM

## 2016-05-05 NOTE — ED Notes (Signed)
Pt has refused to given urine at this time. Per the tech.

## 2016-05-05 NOTE — ED Notes (Signed)
Patient refused in and out cath 

## 2016-05-06 DIAGNOSIS — E876 Hypokalemia: Secondary | ICD-10-CM

## 2016-05-06 DIAGNOSIS — R7401 Elevation of levels of liver transaminase levels: Secondary | ICD-10-CM

## 2016-05-06 DIAGNOSIS — F10231 Alcohol dependence with withdrawal delirium: Principal | ICD-10-CM

## 2016-05-06 DIAGNOSIS — F1010 Alcohol abuse, uncomplicated: Secondary | ICD-10-CM

## 2016-05-06 DIAGNOSIS — R740 Nonspecific elevation of levels of transaminase and lactic acid dehydrogenase [LDH]: Secondary | ICD-10-CM

## 2016-05-06 MED ORDER — POTASSIUM CHLORIDE CRYS ER 20 MEQ PO TBCR
40.0000 meq | EXTENDED_RELEASE_TABLET | Freq: Once | ORAL | Status: AC
  Administered 2016-05-06: 40 meq via ORAL
  Filled 2016-05-06: qty 2

## 2016-05-06 NOTE — Progress Notes (Signed)
Discharge instructions given to pt, verbalized understanding. Pt awaiting transportation.

## 2016-05-06 NOTE — Progress Notes (Signed)
Pt left the unit in stable condition, transported by family.

## 2016-05-06 NOTE — Discharge Summary (Signed)
Physician Discharge Summary  Candice Johnson [id number] DOB: Jul 06, 1966 DOA: 05/04/2016  PCP: Shary Key, MD  Admit date: 05/04/2016 Discharge date: 05/06/2016  Admitted From: Home Disposition:  Home  Recommendations for Outpatient Follow-up:  1. Follow up with PCP in 1-2 weeks 2. Please obtain BMP/CBC in one week   Home Health:No Equipment/Devices:None  Discharge Condition:Stable CODE STATUS:FULL Diet recommendation: Regular   Brief/Interim Summary: 50 year old woman presented to the emergency department after relapse of alcoholism. Noted be tachycardic, hallucinating. Admitted for delirium tremens. Placed on CIWA protocol and given IV fluids. Symptoms resolved within approximately 48 hours patient stable and ready for discharge. No benzodiazepine required in >24 hrs prior to discharge. Patient feels at her baseline at time of discharge. Pts AST/ALT downtreanding at time of discharge. Kdur give to replete K. Recommended outpt resources for continue ETOH cessation  Discharge Diagnoses:  Principal Problem:   Delirium tremens ([place]) Active Problems:   Alcohol withdrawal ([place])   Alcohol problem drinking   Alcohol abuse   Transaminitis   Hypokalemia    Discharge Instructions     Medication List    TAKE these medications   cyclobenzaprine 10 MG tablet Commonly known as:  FLEXERIL Take 1 tablet (10 mg total) by mouth 3 (three) times daily as needed for muscle spasms (and pain).   dicyclomine 10 MG capsule Commonly known as:  BENTYL Take 10 mg by mouth 3 (three) times daily as needed for spasms.   folic acid 1 MG tablet Commonly known as:  FOLVITE Take 1 mg by mouth daily.   hydrochlorothiazide 12.5 MG tablet Commonly known as:  HYDRODIURIL Take 12.5-25 mg by mouth daily as needed for fluid. Swelling in legs   lidocaine 5 % Commonly known as:  LIDODERM Place 1 patch onto the skin daily. Remove & Discard patch within 12 hours or as directed by MD   LORazepam  1 MG tablet Commonly known as:  ATIVAN Take 0.5 mg by mouth at bedtime as needed for sleep or anxiety.   MULTI-VITAMINS Tabs Take 1 tablet by mouth daily.       Allergies  Allergen Reactions  . Amlodipine Besylate Other (See Comments)    Drowsiness.  . Doxycycline Other (See Comments)  . Erythromycin Base Nausea Only  . Lisinopril Other (See Comments)    Cough..    Consultations: none  Procedures/Studies: Dg Chest 2 View  Result Date: 04/25/2016 CLINICAL DATA:  Pain without trauma EXAM: CHEST  2 VIEW COMPARISON:  None. FINDINGS: The heart size and mediastinal contours are within normal limits. Both lungs are clear. The visualized skeletal structures are unremarkable. IMPRESSION: No active cardiopulmonary disease. Electronically Signed   By: Dorise Bullion III M.D   On: 04/25/2016 17:12   Dg Cervical Spine Complete  Result Date: 04/25/2016 CLINICAL DATA:  Neck pain for 4 days.  No trauma. EXAM: CERVICAL SPINE - COMPLETE 4+ VIEW COMPARISON:  None. FINDINGS: The pre odontoid space and prevertebral soft tissues are normal. 2 mm of retrolisthesis of C5 versus C6 is probably degenerative given the lack of trauma. No other malalignment. No fracture identified. Mild degenerative changes at C5-6. The neural foramina are patent. The lateral masses of C1 align with C2. The odontoid process is normal. Lung apices are unremarkable. IMPRESSION: Minimal retrolisthesis of C5 versus C6, likely degenerative given the lack of a trauma history. Mild degenerative changes at this level. No other abnormalities. Electronically Signed   By: Dorise Bullion III M.D   On: 04/25/2016 17:10  Dg Thoracic Spine 2 View  Result Date: 04/25/2016 CLINICAL DATA:  Pain without trauma. EXAM: THORACIC SPINE 2 VIEWS COMPARISON:  None. FINDINGS: There is no evidence of thoracic spine fracture. Alignment is normal. No other significant bone abnormalities are identified. IMPRESSION: Negative. Electronically Signed   By: Dorise Bullion III M.D   On: 04/25/2016 17:11   (Echo, Carotid, EGD, Colonoscopy, ERCP)    Subjective:   Discharge Exam: Vitals:   05/05/16 2356 05/06/16 0512  BP: (!) 149/92 (!) 155/96  Pulse: (!) 102 88  Resp: 16 16  Temp: 99.7 F (37.6 C) 98.8 F (37.1 C)   Vitals:   05/05/16 1200 05/05/16 1345 05/05/16 2356 05/06/16 0512  BP: (!) 143/71 138/89 (!) 149/92 (!) 155/96  Pulse: (!) 101 94 (!) 102 88  Resp: 18 18 16 16   Temp: 98.5 F (36.9 C) 98.8 F (37.1 C) 99.7 F (37.6 C) 98.8 F (37.1 C)  TempSrc: Oral Oral Oral Oral  SpO2: 99% 98% 100% 100%    General: Pt is alert, awake, not in acute distress Cardiovascular: RRR, S1/S2 +, no rubs, no gallops Respiratory: CTA bilaterally, no wheezing, no rhonchi Abdominal: Soft, NT, ND, bowel sounds + Extremities: no edema, no cyanosis Psych: AOx3, pleasant, nml speech Neuro: CN2-12 grossly intact. No tremor. Moves all extremities in coordinated fashion.     The results of significant diagnostics from this hospitalization (including imaging, microbiology, ancillary and laboratory) are listed below for reference.     Microbiology: No results found for this or any previous visit (from the past 240 hour(s)).   Labs: BNP (last 3 results) No results for input(s): BNP in the last 8760 hours. Basic Metabolic Panel:  Recent Labs Lab 05/05/16 0007 05/05/16 0646  NA 141 138  K 3.3* 3.3*  CL 97* 103  CO2 24 22  GLUCOSE 74 63*  BUN 10 8  CREATININE 0.70 0.52  CALCIUM 9.3 8.0*  MG 1.7  --    Liver Function Tests:  Recent Labs Lab 05/05/16 0007 05/05/16 0646  AST 80* 63*  ALT 70* 55*  ALKPHOS 118 102  BILITOT 1.3* 1.4*  PROT 8.2* 7.0  ALBUMIN 4.8 4.0   No results for input(s): LIPASE, AMYLASE in the last 168 hours. No results for input(s): AMMONIA in the last 168 hours. CBC:  Recent Labs Lab 05/05/16 0007 05/05/16 0646  WBC 5.0 3.3*  NEUTROABS 2.2 1.7  HGB 13.0 11.1*  HCT 37.4 32.5*  MCV 92.1 93.7  PLT 230  175   Cardiac Enzymes: No results for input(s): CKTOTAL, CKMB, CKMBINDEX, TROPONINI in the last 168 hours. BNP: Invalid input(s): POCBNP CBG: No results for input(s): GLUCAP in the last 168 hours. D-Dimer No results for input(s): DDIMER in the last 72 hours. Hgb A1c No results for input(s): HGBA1C in the last 72 hours. Lipid Profile No results for input(s): CHOL, HDL, LDLCALC, TRIG, CHOLHDL, LDLDIRECT in the last 72 hours. Thyroid function studies No results for input(s): TSH, T4TOTAL, T3FREE, THYROIDAB in the last 72 hours.  Invalid input(s): FREET3 Anemia work up No results for input(s): VITAMINB12, FOLATE, FERRITIN, TIBC, IRON, RETICCTPCT in the last 72 hours. Urinalysis    Component Value Date/Time   COLORURINE YELLOW 04/25/2016 1602   APPEARANCEUR CLOUDY (A) 04/25/2016 1602   LABSPEC 1.015 04/25/2016 1602   PHURINE 7.5 04/25/2016 1602   GLUCOSEU NEGATIVE 04/25/2016 1602   HGBUR NEGATIVE 04/25/2016 1602   BILIRUBINUR NEGATIVE 04/25/2016 1602   KETONESUR NEGATIVE 04/25/2016 1602   PROTEINUR  NEGATIVE 04/25/2016 1602   NITRITE NEGATIVE 04/25/2016 1602   LEUKOCYTESUR NEGATIVE 04/25/2016 1602   Sepsis Labs Invalid input(s): PROCALCITONIN,  WBC,  LACTICIDVEN Microbiology No results found for this or any previous visit (from the past 240 hour(s)).   Time coordinating discharge: Over 30 minutes  SIGNED:   Waldemar Dickens, MD  [facility] 05/06/2016, 9:59 AM Pager   If 7PM-7AM, please contact night-coverage [email or web address] Password [email or web address]

## 2016-05-06 NOTE — Discharge Instructions (Signed)
You were admitted to the hospital for complications associated with alcohol use and withdrawal. You did very well during her hospitalization. Please continue to seek help through community resources family and friends. You may use her Ativan as needed for any additional withdrawal symptoms or frequency or generalized anxiety. Please follow-up with your primary care physician in 1-2 weeks. You can typically get this appointment scheduled with you call them in saline urine.

## 2017-10-27 IMAGING — CR DG THORACIC SPINE 2V
3 series · 3 of 3 positions shown · non-contrast
Comparison: None.

CLINICAL DATA: Pain without trauma.

EXAM:
THORACIC SPINE 2 VIEWS

[w thoracic swimmers]
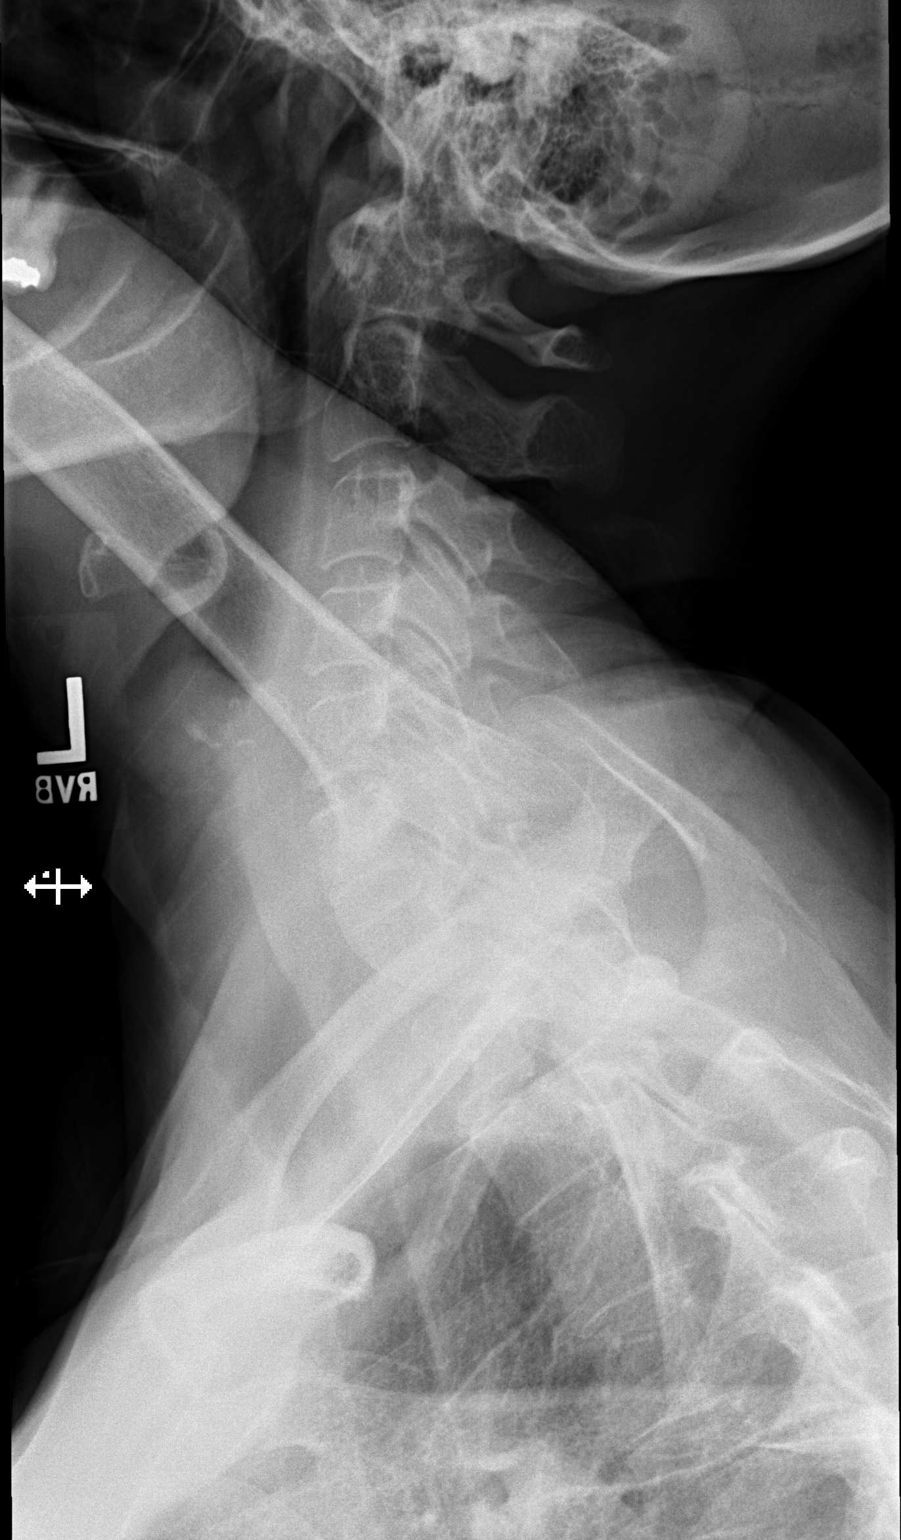

[t thoracic spine ap]
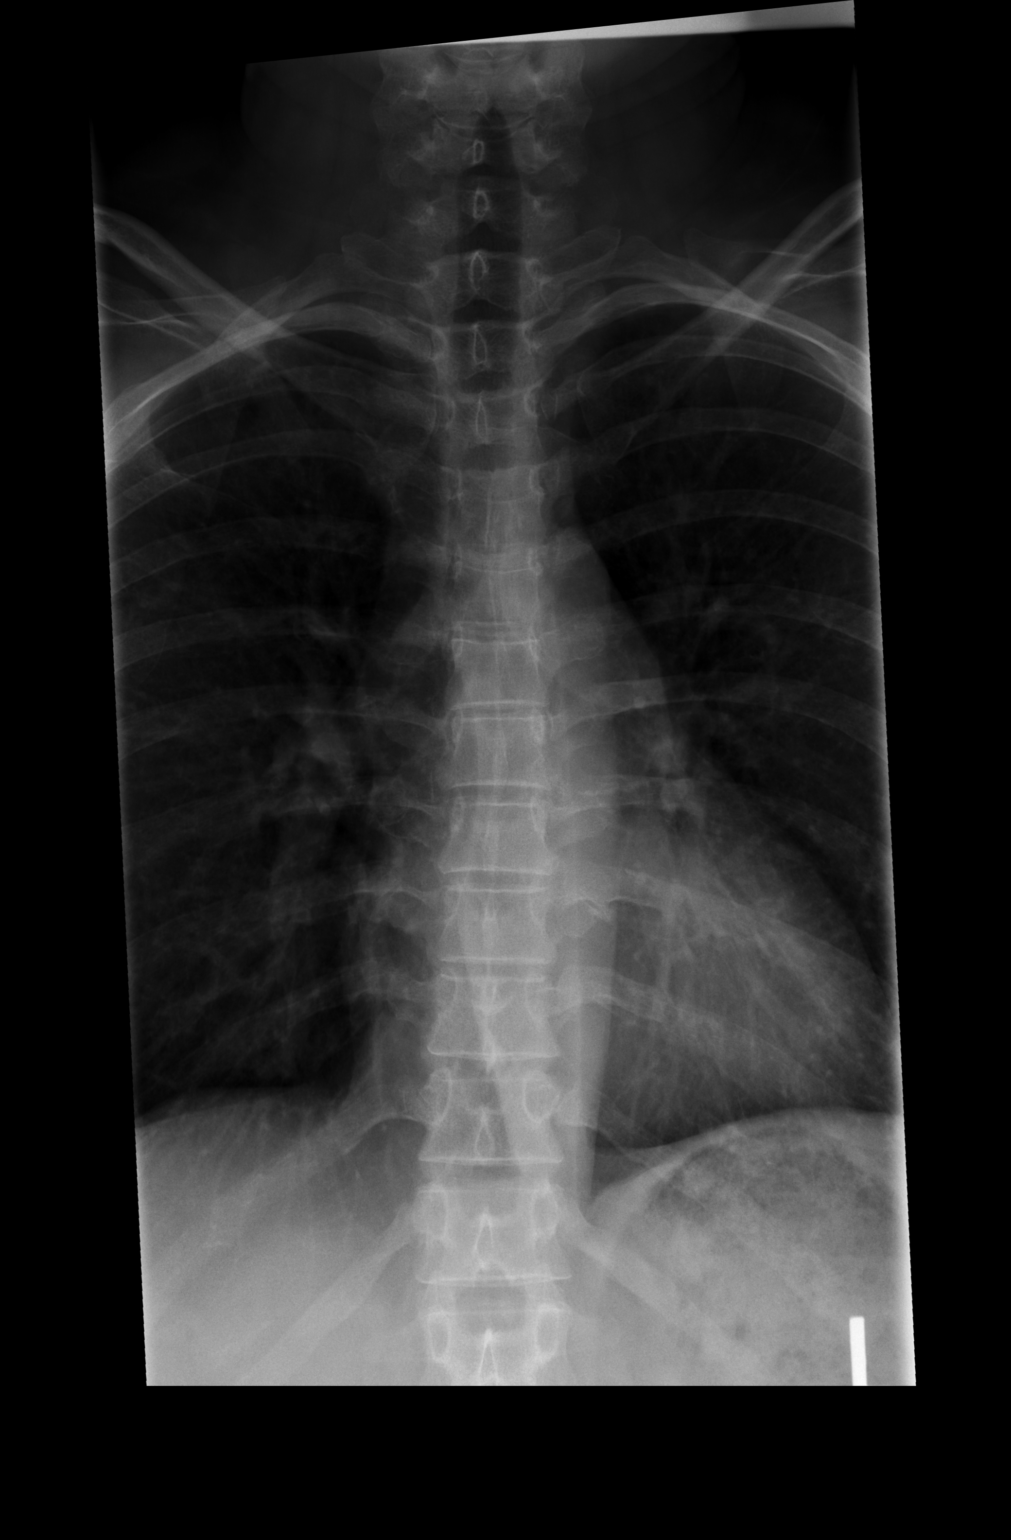

[t thoracic spine lat]
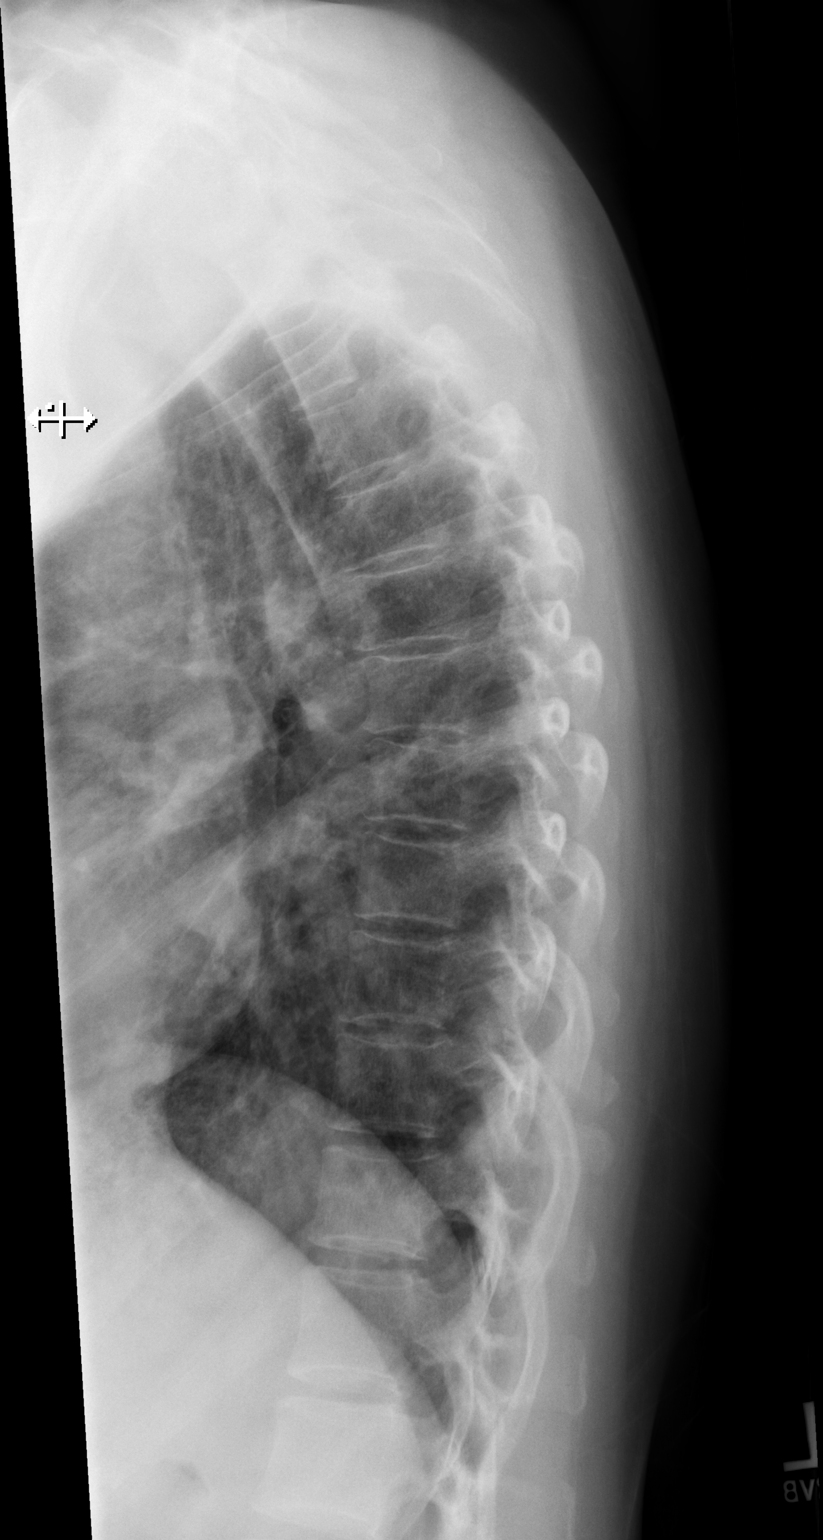

[3 of 3 positions shown; findings below may reference images not displayed]

FINDINGS: There is no evidence of thoracic spine fracture. Alignment is
normal. No other significant bone abnormalities are identified.
IMPRESSION: Negative.

## 2019-08-22 DEATH — deceased
